# Patient Record
Sex: Female | Born: 1980 | Race: White | Hispanic: No | Marital: Single | Smoking: Current every day smoker
Health system: Southern US, Community
[De-identification: ages and names within clinical notes are randomized; demographics above are authoritative.]

## PROBLEM LIST (undated history)

## (undated) DIAGNOSIS — K8020 Calculus of gallbladder without cholecystitis without obstruction: Secondary | ICD-10-CM

## (undated) DIAGNOSIS — K219 Gastro-esophageal reflux disease without esophagitis: Secondary | ICD-10-CM

## (undated) HISTORY — DX: Calculus of gallbladder without cholecystitis without obstruction: K80.20

## (undated) HISTORY — PX: CHOLECYSTECTOMY: SHX55

## (undated) HISTORY — PX: APPENDECTOMY: SHX54

---
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
## 2001-02-04 ENCOUNTER — Other Ambulatory Visit: Admission: RE | Admit: 2001-02-04 | Discharge: 2001-02-04 | Payer: Self-pay | Admitting: Obstetrics & Gynecology

## 2001-02-04 ENCOUNTER — Other Ambulatory Visit: Admission: RE | Admit: 2001-02-04 | Discharge: 2001-02-04 | Payer: Self-pay | Admitting: Gynecology

## 2002-02-09 ENCOUNTER — Other Ambulatory Visit: Admission: RE | Admit: 2002-02-09 | Discharge: 2002-02-09 | Payer: Self-pay | Admitting: Obstetrics and Gynecology

## 2003-04-01 ENCOUNTER — Encounter: Payer: Self-pay | Admitting: Family Medicine

## 2003-04-01 ENCOUNTER — Encounter: Admission: RE | Admit: 2003-04-01 | Discharge: 2003-04-01 | Payer: Self-pay | Admitting: Family Medicine

## 2004-04-27 ENCOUNTER — Emergency Department (HOSPITAL_COMMUNITY): Admission: EM | Admit: 2004-04-27 | Discharge: 2004-04-27 | Payer: Self-pay | Admitting: Emergency Medicine

## 2010-07-14 ENCOUNTER — Encounter: Payer: Self-pay | Admitting: Neurology

## 2011-06-02 ENCOUNTER — Encounter: Payer: Self-pay | Admitting: Emergency Medicine

## 2011-06-02 ENCOUNTER — Emergency Department (HOSPITAL_BASED_OUTPATIENT_CLINIC_OR_DEPARTMENT_OTHER)
Admission: EM | Admit: 2011-06-02 | Discharge: 2011-06-02 | Disposition: A | Discharge status: Home / Self Care | Payer: BC Managed Care – PPO | Attending: Emergency Medicine | Admitting: Emergency Medicine

## 2011-06-02 DIAGNOSIS — J069 Acute upper respiratory infection, unspecified: Secondary | ICD-10-CM | POA: Insufficient documentation

## 2011-06-02 DIAGNOSIS — F17200 Nicotine dependence, unspecified, uncomplicated: Secondary | ICD-10-CM | POA: Insufficient documentation

## 2011-06-02 DIAGNOSIS — K219 Gastro-esophageal reflux disease without esophagitis: Secondary | ICD-10-CM | POA: Insufficient documentation

## 2011-06-02 HISTORY — DX: Gastro-esophageal reflux disease without esophagitis: K21.9

## 2011-06-02 LAB — RAPID STREP SCREEN (MED CTR MEBANE ONLY): Streptococcus, Group A Screen (Direct): NEGATIVE

## 2011-06-02 NOTE — ED Notes (Signed)
Flu-like symptoms since Friday.  Runny nose, fever, cough, general aches, No GI symptoms.  Bilateral ear pain/pressure.

## 2011-06-02 NOTE — ED Provider Notes (Signed)
History     CSN: [id number] Arrival date & time: 06/02/2011 11:47 AM   First MD Initiated Contact with Patient 06/02/11 1205      Chief Complaint  Patient presents with  . Nasal Congestion  . Cough  . Fever  . Influenza    (Consider location/radiation/quality/duration/timing/severity/associated sxs/prior treatment) HPI Comments: Patient present complaining of cough and cold symptoms since Friday.  She notes that her 30-year-old son who is also here with her as a patient today has had similar symptoms since Wednesday.  She describes subjective on and off fevers, mild headaches and sinus congestion and drainage.  No nausea or vomiting.  Mild cough.  No chest pain or shortness of breath.  Patient is a 30 y.o. female presenting with cough, fever, and flu symptoms. The history is provided by the patient. No language interpreter was used.  Cough This is a new problem. The current episode started 2 days ago. The problem occurs hourly. The problem has not changed since onset.The cough is non-productive. Associated symptoms include headaches, rhinorrhea, sore throat and myalgias. Pertinent negatives include no chest pain, no chills, no shortness of breath and no eye redness.  Fever Primary symptoms of the febrile illness include fever, headaches, cough and myalgias. Primary symptoms do not include shortness of breath, abdominal pain, nausea, vomiting, diarrhea, dysuria or rash.  Influenza Associated symptoms include headaches. Pertinent negatives include no chest pain, no abdominal pain and no shortness of breath.    Past Medical History  Diagnosis Date  . GERD (gastroesophageal reflux disease)     Past Surgical History  Procedure Date  . Cesarean section   . Appendectomy     History reviewed. No pertinent family history.  History  Substance Use Topics  . Smoking status: Current Everyday Smoker -- 0.5 packs/day  . Smokeless tobacco: Not on file  . Alcohol Use: Yes     occasional      OB History    Grav Para Term Preterm Abortions TAB SAB Ect Mult Living                  Review of Systems  Constitutional: Positive for fever. Negative for chills.  HENT: Positive for congestion, sore throat, rhinorrhea and sinus pressure.   Eyes: Negative.  Negative for discharge and redness.  Respiratory: Positive for cough. Negative for shortness of breath.   Cardiovascular: Negative.  Negative for chest pain.  Gastrointestinal: Negative.  Negative for nausea, vomiting, abdominal pain and diarrhea.  Genitourinary: Negative.  Negative for dysuria and vaginal discharge.  Musculoskeletal: Positive for myalgias. Negative for back pain.  Skin: Negative.  Negative for color change and rash.  Neurological: Positive for headaches. Negative for syncope.  Hematological: Negative.  Negative for adenopathy.  Psychiatric/Behavioral: Negative.  Negative for confusion.  All other systems reviewed and are negative.    Allergies  Review of patient's allergies indicates no known allergies.  Home Medications   Current Outpatient Rx  Name Route Sig Dispense Refill  . PANTOPRAZOLE SODIUM 40 MG PO TBEC Oral Take 40 mg by mouth daily.      Marland Kitchen PHENTERMINE HCL 15 MG PO CAPS Oral Take 15 mg by mouth every morning.        BP 103/56  Pulse 86  Temp(Src) 98.5 F (36.9 C) (Oral)  Resp 20  SpO2 99%  LMP 05/19/2011  Physical Exam  Constitutional: She is oriented to person, place, and time. She appears well-developed and well-nourished.  Non-toxic appearance. She does not have  a sickly appearance.  HENT:  Head: Normocephalic and atraumatic.  Mouth/Throat: No oropharyngeal exudate.       Bilaterally erythematous and slightly swollen tonsils.  No exudates.  Uvula midline.  Eyes: Conjunctivae, EOM and lids are normal. Pupils are equal, round, and reactive to light. No scleral icterus.  Neck: Trachea normal and normal range of motion. Neck supple.  Cardiovascular: Normal rate, regular rhythm and  normal heart sounds.   Pulmonary/Chest: Effort normal and breath sounds normal. No respiratory distress. She has no wheezes. She has no rales.  Abdominal: Soft. Normal appearance. There is no tenderness. There is no rebound, no guarding and no CVA tenderness.  Musculoskeletal: Normal range of motion.  Neurological: She is alert and oriented to person, place, and time. She has normal strength.  Skin: Skin is warm, dry and intact. No rash noted.  Psychiatric: She has a normal mood and affect. Her behavior is normal. Judgment and thought content normal.    ED Course  Procedures (including critical care time)  Results for orders placed during the hospital encounter of 06/02/11  RAPID STREP SCREEN      Component Value Range   Streptococcus, Group A Screen (Direct) NEGATIVE  NEGATIVE         MDM  Patient with likely viral syndrome possible influenza.  At this point I feel she is safe for discharge home as her vital signs are normal.  She has been counseled regarding ibuprofen and Tylenol as well as over-the-counter cold medicines.        Nat Christen, MD 06/02/11 1247

## 2011-06-02 NOTE — ED Notes (Signed)
Pt and family left prior to receiving written instructions.  Verbal instructions given per Dr. Golda Acre.  Written instructions mailed to home per [place] postal [employer].

## 2011-06-02 NOTE — ED Notes (Signed)
Pt left without receiving written instructions.  Pt received verbal instructions per Dr. Golda Acre.  Instructions mailed to patients home.

## 2012-07-19 ENCOUNTER — Encounter (HOSPITAL_BASED_OUTPATIENT_CLINIC_OR_DEPARTMENT_OTHER): Payer: Self-pay

## 2012-07-19 ENCOUNTER — Emergency Department (HOSPITAL_BASED_OUTPATIENT_CLINIC_OR_DEPARTMENT_OTHER): Payer: BC Managed Care – PPO

## 2012-07-19 ENCOUNTER — Emergency Department (HOSPITAL_BASED_OUTPATIENT_CLINIC_OR_DEPARTMENT_OTHER)
Admission: EM | Admit: 2012-07-19 | Discharge: 2012-07-19 | Disposition: A | Discharge status: Home / Self Care | Payer: BC Managed Care – PPO | Attending: Emergency Medicine | Admitting: Emergency Medicine

## 2012-07-19 DIAGNOSIS — Z79899 Other long term (current) drug therapy: Secondary | ICD-10-CM | POA: Insufficient documentation

## 2012-07-19 DIAGNOSIS — K8070 Calculus of gallbladder and bile duct without cholecystitis without obstruction: Secondary | ICD-10-CM | POA: Insufficient documentation

## 2012-07-19 DIAGNOSIS — R079 Chest pain, unspecified: Secondary | ICD-10-CM | POA: Insufficient documentation

## 2012-07-19 DIAGNOSIS — Z3202 Encounter for pregnancy test, result negative: Secondary | ICD-10-CM | POA: Insufficient documentation

## 2012-07-19 DIAGNOSIS — K8020 Calculus of gallbladder without cholecystitis without obstruction: Secondary | ICD-10-CM

## 2012-07-19 DIAGNOSIS — R630 Anorexia: Secondary | ICD-10-CM | POA: Insufficient documentation

## 2012-07-19 DIAGNOSIS — F17200 Nicotine dependence, unspecified, uncomplicated: Secondary | ICD-10-CM | POA: Insufficient documentation

## 2012-07-19 DIAGNOSIS — R110 Nausea: Secondary | ICD-10-CM | POA: Insufficient documentation

## 2012-07-19 DIAGNOSIS — K219 Gastro-esophageal reflux disease without esophagitis: Secondary | ICD-10-CM | POA: Insufficient documentation

## 2012-07-19 DIAGNOSIS — M549 Dorsalgia, unspecified: Secondary | ICD-10-CM | POA: Insufficient documentation

## 2012-07-19 LAB — COMPREHENSIVE METABOLIC PANEL
Albumin: 3.7 g/dL (ref 3.5–5.2)
Alkaline Phosphatase: 45 U/L (ref 39–117)
BUN: 11 mg/dL (ref 6–23)
CO2: 23 mEq/L (ref 19–32)
Chloride: 102 mEq/L (ref 96–112)
Creatinine, Ser: 0.7 mg/dL (ref 0.50–1.10)
GFR calc non Af Amer: >90 mL/min (ref >90)
Glucose, Bld: 113 mg/dL — ABNORMAL HIGH (ref 70–99)
Potassium: 4.4 mEq/L (ref 3.5–5.1)
Total Bilirubin: 0.2 mg/dL — ABNORMAL LOW (ref 0.3–1.2)

## 2012-07-19 LAB — CBC WITH DIFFERENTIAL/PLATELET
HCT: 34.9 % — ABNORMAL LOW (ref 36.0–46.0)
Hemoglobin: 12 g/dL (ref 12.0–15.0)
Lymphocytes Relative: 21 % (ref 12–46)
Lymphs Abs: 2.5 10*3/uL (ref 0.7–4.0)
MCHC: 34.4 g/dL (ref 30.0–36.0)
Monocytes Absolute: 0.3 10*3/uL (ref 0.1–1.0)
Monocytes Relative: 3 % (ref 3–12)
Neutro Abs: 9 10*3/uL — ABNORMAL HIGH (ref 1.7–7.7)
Neutrophils Relative %: 76 % (ref 43–77)
RBC: 3.74 MIL/uL — ABNORMAL LOW (ref 3.87–5.11)

## 2012-07-19 LAB — URINALYSIS, ROUTINE W REFLEX MICROSCOPIC
Bilirubin Urine: NEGATIVE
Ketones, ur: NEGATIVE mg/dL
Nitrite: NEGATIVE
Protein, ur: NEGATIVE mg/dL
pH: 6 (ref 5.0–8.0)

## 2012-07-19 LAB — LIPASE, BLOOD: Lipase: 21 U/L (ref 11–59)

## 2012-07-19 MED ORDER — SODIUM CHLORIDE 0.9 % IV BOLUS (SEPSIS)
1000.0000 mL | Freq: Once | INTRAVENOUS | Status: AC
  Administered 2012-07-19: 1000 mL via INTRAVENOUS

## 2012-07-19 MED ORDER — HYDROMORPHONE HCL PF 1 MG/ML IJ SOLN
1.0000 mg | Freq: Once | INTRAMUSCULAR | Status: AC
  Administered 2012-07-19: 1 mg via INTRAVENOUS
  Filled 2012-07-19: qty 1

## 2012-07-19 MED ORDER — OXYCODONE-ACETAMINOPHEN 5-325 MG PO TABS: 1.0000 | ORAL_TABLET | ORAL | Status: AC | PRN

## 2012-07-19 MED ORDER — ONDANSETRON 8 MG PO TBDP: 8.0000 mg | ORAL_TABLET | Freq: Three times a day (TID) | ORAL | Status: AC | PRN

## 2012-07-19 MED ORDER — ONDANSETRON HCL 4 MG/2ML IJ SOLN
4.0000 mg | Freq: Once | INTRAMUSCULAR | Status: AC
  Administered 2012-07-19: 4 mg via INTRAVENOUS
  Filled 2012-07-19: qty 2

## 2012-07-19 NOTE — ED Notes (Signed)
Pt states that she has bilateral lower chest wall pain and upper abdominal pain.  Pt states that she has severe sharp stabbing pain which starts in the chest and radiates through to the back.  Pt states that she woke up at 0300 with this pain and it has not subsided since that time.  Pt denies sob, n/v, diaphoresis, hemoptysis, cough, fever.

## 2012-07-19 NOTE — ED Notes (Signed)
MD at bedside. 

## 2012-07-19 NOTE — ED Provider Notes (Signed)
History     CSN: [id number]  Arrival date & time 07/19/12  0865   First MD Initiated Contact with Patient 07/19/12 1021      Chief Complaint  Patient presents with  . Abdominal Pain  . Chest Pain    (Consider location/radiation/quality/duration/timing/severity/associated sxs/prior treatment) HPI Comments: Pain with inspiration.   Patient is a 32 y.o. female presenting with abdominal pain and chest pain. The history is provided by the patient.  Abdominal Pain The primary symptoms of the illness include abdominal pain and nausea. The primary symptoms of the illness do not include fever, fatigue, shortness of breath, vomiting, diarrhea, hematemesis, hematochezia, dysuria, vaginal discharge or vaginal bleeding. The current episode started 6 to 12 hours ago. The onset of the illness was sudden. The problem has not changed since onset. The patient states that she believes she is currently not pregnant. The patient has not had a change in bowel habit. Additional symptoms associated with the illness include anorexia and back pain. Symptoms associated with the illness do not include chills, urgency, hematuria or frequency. Significant associated medical issues include GERD. Significant associated medical issues do not include PUD, inflammatory bowel disease, diabetes, sickle cell disease, gallstones, liver disease, substance abuse, diverticulitis, HIV or cardiac disease.  Chest Pain Primary symptoms include abdominal pain and nausea. Pertinent negatives for primary symptoms include no fever, no fatigue, no shortness of breath and no vomiting.  Pertinent negatives for past medical history include no diabetes and no sickle cell disease.     Patient with epigastric pain since 0300.  Pain radiates to back and is sharp and stabbing.  Patient has some nausea but no vomiting.  She has not eaten today.  She denies fever, chills, cough, uti symptoms, similar symptoms in past.  She has one child and lmp was  two weeks ago, she is on bcp and denies abnormal vaginal discharge or h.o. Stds.  Pain is 7-8.  She took ibuprofen without relief.   Past Medical History  Diagnosis Date  . GERD (gastroesophageal reflux disease)     Past Surgical History  Procedure Date  . Cesarean section   . Appendectomy     History reviewed. No pertinent family history.  History  Substance Use Topics  . Smoking status: Current Every Day Smoker -- 0.5 packs/day    Types: Cigarettes  . Smokeless tobacco: Never Used  . Alcohol Use: Yes     Comment: occasional    OB History    Grav Para Term Preterm Abortions TAB SAB Ect Mult Living                  Review of Systems  Constitutional: Negative for fever, chills and fatigue.  Respiratory: Negative for shortness of breath.   Cardiovascular: Positive for chest pain.  Gastrointestinal: Positive for nausea, abdominal pain and anorexia. Negative for vomiting, diarrhea, hematochezia and hematemesis.  Genitourinary: Negative for dysuria, urgency, frequency, hematuria, vaginal bleeding and vaginal discharge.  Musculoskeletal: Positive for back pain.  All other systems reviewed and are negative.    Allergies  Review of patient's allergies indicates no known allergies.  Home Medications   Current Outpatient Rx  Name  Route  Sig  Dispense  Refill  . IBUPROFEN 200 MG PO TABS   Oral   Take 600 mg by mouth every 6 (six) hours as needed.         Kathrynn Running ESTRAD-FE BIPHAS 1 MG-10 MCG / 10 MCG PO TABS   Oral  Take 1 tablet by mouth daily.         Marland Kitchen PANTOPRAZOLE SODIUM 40 MG PO TBEC   Oral   Take 40 mg by mouth daily.           Marland Kitchen PHENTERMINE HCL 15 MG PO CAPS   Oral   Take 15 mg by mouth every morning.             BP 111/59  Pulse 77  Temp 98.7 F (37.1 C) (Oral)  Resp 16  Ht 5' (1.524 m)  Wt 135 lb (61.236 kg)  BMI 26.37 kg/m2  SpO2 100%  LMP 07/05/2012  Physical Exam  Nursing note and vitals reviewed. Constitutional: She  appears well-developed and well-nourished.  HENT:  Head: Normocephalic and atraumatic.  Eyes: Conjunctivae normal and EOM are normal. Pupils are equal, round, and reactive to light.  Neck: Normal range of motion. Neck supple.  Cardiovascular: Normal rate, regular rhythm, normal heart sounds and intact distal pulses.   Pulmonary/Chest: Effort normal and breath sounds normal.  Abdominal: Soft. Bowel sounds are normal. There is tenderness.       ruq pain with palpation.   Musculoskeletal: Normal range of motion.  Neurological: She is alert.  Skin: Skin is warm and dry.  Psychiatric: She has a normal mood and affect. Thought content normal.    ED Course  Procedures (including critical care time)  Labs Reviewed - No data to display No results found.   No diagnosis found.  Results for orders placed during the hospital encounter of 07/19/12  CBC WITH DIFFERENTIAL      Component Value Range   WBC 11.9 (*) 4.0 - 10.5 K/uL   RBC 3.74 (*) 3.87 - 5.11 MIL/uL   Hemoglobin 12.0  12.0 - 15.0 g/dL   HCT 16.1 (*) 09.6 - 04.5 %   MCV 93.3  78.0 - 100.0 fL   MCH 32.1  26.0 - 34.0 pg   MCHC 34.4  30.0 - 36.0 g/dL   RDW 40.9  81.1 - 91.4 %   Platelets 277  150 - 400 K/uL   Neutrophils Relative 76  43 - 77 %   Neutro Abs 9.0 (*) 1.7 - 7.7 K/uL   Lymphocytes Relative 21  12 - 46 %   Lymphs Abs 2.5  0.7 - 4.0 K/uL   Monocytes Relative 3  3 - 12 %   Monocytes Absolute 0.3  0.1 - 1.0 K/uL   Eosinophils Relative 0  0 - 5 %   Eosinophils Absolute 0.1  0.0 - 0.7 K/uL   Basophils Relative 0  0 - 1 %   Basophils Absolute 0.0  0.0 - 0.1 K/uL  COMPREHENSIVE METABOLIC PANEL      Component Value Range   Sodium 137  135 - 145 mEq/L   Potassium 4.4  3.5 - 5.1 mEq/L   Chloride 102  96 - 112 mEq/L   CO2 23  19 - 32 mEq/L   Glucose, Bld 113 (*) 70 - 99 mg/dL   BUN 11  6 - 23 mg/dL   Creatinine, Ser 7.82  0.50 - 1.10 mg/dL   Calcium 9.4  8.4 - 95.6 mg/dL   Total Protein 6.8  6.0 - 8.3 g/dL   Albumin  3.7  3.5 - 5.2 g/dL   AST 17  0 - 37 U/L   ALT 15  0 - 35 U/L   Alkaline Phosphatase 45  39 - 117 U/L   Total Bilirubin 0.2 (*)  0.3 - 1.2 mg/dL   GFR calc non Af Amer >90  >90 mL/min   GFR calc Af Amer >90  >90 mL/min  LIPASE, BLOOD      Component Value Range   Lipase 21  11 - 59 U/L  URINALYSIS, ROUTINE W REFLEX MICROSCOPIC      Component Value Range   Color, Urine YELLOW  YELLOW   APPearance CLEAR  CLEAR   Specific Gravity, Urine 1.005  1.005 - 1.030   pH 6.0  5.0 - 8.0   Glucose, UA NEGATIVE  NEGATIVE mg/dL   Hgb urine dipstick NEGATIVE  NEGATIVE   Bilirubin Urine NEGATIVE  NEGATIVE   Ketones, ur NEGATIVE  NEGATIVE mg/dL   Protein, ur NEGATIVE  NEGATIVE mg/dL   Urobilinogen, UA 0.2  0.0 - 1.0 mg/dL   Nitrite NEGATIVE  NEGATIVE   Leukocytes, UA NEGATIVE  NEGATIVE  PREGNANCY, URINE      Component Value Range   Preg Test, Ur NEGATIVE  NEGATIVE     Patient feels improved after pain meds which were given just prior to ultrasound.  Patient with elevated wbc.  Discussed with Dr. Ezzard Standing and patient.  Patient wishes discharge and is given precautions for return.  She is referred to the general surgery office to be seen asap and arrange for op surgery.          Hilario Quarry, MD 07/19/12 1339

## 2012-07-20 ENCOUNTER — Ambulatory Visit (INDEPENDENT_AMBULATORY_CARE_PROVIDER_SITE_OTHER): Payer: BC Managed Care – PPO | Admitting: General Surgery

## 2012-07-20 ENCOUNTER — Encounter (INDEPENDENT_AMBULATORY_CARE_PROVIDER_SITE_OTHER): Payer: Self-pay | Admitting: General Surgery

## 2012-07-20 ENCOUNTER — Telehealth (INDEPENDENT_AMBULATORY_CARE_PROVIDER_SITE_OTHER): Payer: Self-pay

## 2012-07-20 VITALS — BP 108/70 | HR 72 | Temp 98.0°F | Resp 18 | Ht 60.0 in | Wt 134.4 lb

## 2012-07-20 DIAGNOSIS — K8020 Calculus of gallbladder without cholecystitis without obstruction: Secondary | ICD-10-CM | POA: Insufficient documentation

## 2012-07-20 NOTE — Patient Instructions (Signed)
Strict low-fat diet as discussed.  [facility] ______[facility] LAPAROSCOPIC SURGERY: POST OP INSTRUCTIONS Always review your discharge instruction sheet given to you by the facility where your surgery was performed. IF YOU HAVE DISABILITY OR FAMILY LEAVE FORMS, YOU MUST BRING THEM TO THE OFFICE FOR PROCESSING.   DO NOT GIVE THEM TO YOUR DOCTOR.  1. A prescription for pain medication may be given to you upon discharge.  Take your pain medication as prescribed, if needed.  If narcotic pain medicine is not needed, then you may take acetaminophen (Tylenol) or ibuprofen (Advil) as needed. 2. Take your usually prescribed medications unless otherwise directed. 3. If you need a refill on your pain medication, please contact your pharmacy.  They will contact our office to request authorization. Prescriptions will not be filled after 5pm or on week-ends. 4. You should follow a light diet the first few days after arrival home, such as soup and crackers, etc.  Be sure to include lots of fluids daily. 5. Most patients will experience some swelling and bruising in the area of the incisions.  Ice packs will help.  Swelling and bruising can take several days to resolve.  6. It is common to experience some constipation if taking pain medication after surgery.  Increasing fluid intake and taking a stool softener (such as Colace) will usually help or prevent this problem from occurring.  A mild laxative (Milk of Magnesia or Miralax) should be taken according to package instructions if there are no bowel movements after 48 hours. 7. Unless discharge instructions indicate otherwise, you may remove your bandages 24-48 hours after surgery, and you may shower at that time.  You may have steri-strips (small skin tapes) in place directly over the incision.  These strips should be left on the skin for 7-10 days.  If your surgeon used skin glue on the incision, you may shower in 24 hours.  The glue will flake off  over the next 2-3 weeks.  Any sutures or staples will be removed at the office during your follow-up visit. 8. ACTIVITIES:  You may resume regular (light) daily activities beginning the next day-such as daily self-care, walking, climbing stairs-gradually increasing activities as tolerated.  You may have sexual intercourse when it is comfortable.  Refrain from any heavy lifting or straining until approved by your doctor. a. You may drive when you are no longer taking prescription pain medication, you can comfortably wear a seatbelt, and you can safely maneuver your car and apply brakes. b. RETURN TO WORK:  __________________________________________________________ 9. You should see your doctor in the office for a follow-up appointment approximately 2-3 weeks after your surgery.  Make sure that you call for this appointment within a day or two after you arrive home to insure a convenient appointment time. 10. OTHER INSTRUCTIONS: __________________________________________________________________________________________________________________________ __________________________________________________________________________________________________________________________ WHEN TO CALL YOUR DOCTOR: 1. Fever over 101.0 2. Inability to urinate 3. Continued bleeding from incision. 4. Increased pain, redness, or drainage from the incision. 5. Increasing abdominal pain  The clinic staff is available to answer your questions during regular business hours.  Please don't hesitate to call and ask to speak to one of the nurses for clinical concerns.  If you have a medical emergency, go to the nearest emergency room or call 911.  A surgeon from [facility] is always on call at the hospital. [address], [address], [place], [place]  [address] ? [address] [address], [place], [place]   [address] [phone] ? [phone] ?  FAX [phone] Web site: [email or web address]

## 2012-07-20 NOTE — Progress Notes (Signed)
Patient ID: Donna Duncan, female   DOB: Jun 30, 1980, 32 y.o.   MRN: [id number]  No chief complaint on file.   HPI Donna Duncan is a 32 y.o. female.   HPI  She is referred by Dr. Rosalia Hammers from the emergency department to evaluate symptomatic cholelithiasis. 2 nights ago she had some pizza then awoke early the next morning with severe pressure-type epigastric pain radiating to the back and associated with nausea and vomiting. She went to the emergency department was evaluated. Lipase liver function tests were normal. Ultrasound demonstrated cholelithiasis with no evidence of acute cholecystitis. She improved while in the emergency department and was discharged with pain medication and nausea medication. She was subsequently told to call here for an appointment. She has not had anything like this before. No family history of gallbladder disease.  Past Medical History  Diagnosis Date  . GERD (gastroesophageal reflux disease)   . Gallstones     Past Surgical History  Procedure Date  . Cesarean section   . Appendectomy     No family history on file.  Social History History  Substance Use Topics  . Smoking status: Current Every Day Smoker -- 0.5 packs/day    Types: Cigarettes  . Smokeless tobacco: Never Used  . Alcohol Use: Yes     Comment: occasional    No Known Allergies  Current Outpatient Prescriptions  Medication Sig Dispense Refill  . ibuprofen (ADVIL,MOTRIN) 200 MG tablet Take 600 mg by mouth every 6 (six) hours as needed.      . Norethindrone-Ethinyl Estradiol-Fe Biphas (LO LOESTRIN FE) 1 MG-10 MCG / 10 MCG tablet Take 1 tablet by mouth daily.      . ondansetron (ZOFRAN ODT) 8 MG disintegrating tablet Take 1 tablet (8 mg total) by mouth every 8 (eight) hours as needed for nausea.  20 tablet  0  . oxyCODONE-acetaminophen (PERCOCET/ROXICET) 5-325 MG per tablet Take 1 tablet by mouth every 4 (four) hours as needed for pain.  10 tablet  0  . pantoprazole (PROTONIX) 40 MG  tablet Take 40 mg by mouth daily.        . phentermine 15 MG capsule Take 15 mg by mouth every morning.          Review of Systems Review of Systems  Constitutional: Negative for fever and chills.  Respiratory: Negative.   Gastrointestinal: Positive for nausea and abdominal pain.  Genitourinary: Negative.   Hematological: Negative.     Blood pressure 108/70, pulse 72, temperature 98 F (36.7 C), temperature source Temporal, resp. rate 18, height 5' (1.524 m), weight 134 lb 6.4 oz (60.963 kg), last menstrual period 07/05/2012.  Physical Exam Physical Exam  Constitutional: She appears well-developed and well-nourished. No distress.  HENT:  Head: Normocephalic and atraumatic.  Eyes: EOM are normal. No scleral icterus.  Neck: Neck supple.  Cardiovascular: Normal rate and regular rhythm.   Pulmonary/Chest: Effort normal and breath sounds normal.  Abdominal: Soft. She exhibits no distension and no mass. There is no tenderness.       Small subumbilical scar  Musculoskeletal: She exhibits no edema.  Lymphadenopathy:    She has no cervical adenopathy.  Neurological: She is alert.  Skin: Skin is warm and dry.    Data Reviewed Emergency department notes. Laboratory values. Ultrasound report.  Assessment    Symptomatic cholelithiasis.    Plan    Laparoscopic possible open cholecystectomy. Strict low-fat diet.  I have explained the procedure, risks, and aftercare of cholecystectomy.  Risks  include but are not limited to bleeding, infection, wound problems, anesthesia, diarrhea, bile leak, injury to common bile duct/liver/intestine.  She seems to understand and agrees to proceed.        Mahiya Kercheval J 07/20/2012, 3:18 PM

## 2012-07-20 NOTE — Telephone Encounter (Signed)
Rx Hydrocodone 5/325 #30 1-2 q 4-6 hrs prn pain called to pharmacy.  Pt is aware.

## 2012-07-24 ENCOUNTER — Telehealth (INDEPENDENT_AMBULATORY_CARE_PROVIDER_SITE_OTHER): Payer: Self-pay | Admitting: General Surgery

## 2012-07-24 NOTE — Telephone Encounter (Signed)
Pt is scheduled for lap chole next week; constipated since Sunday.  Pt states she is taking Colace BID and Miralax 1 capful BID.  She is passing some small amount of gas.  Recommended pt increase po fluids and walk AMAP.  Increase Miralax to 2 capsful in 10-12 oz fluid BID.  She will try all this.

## 2012-07-30 ENCOUNTER — Other Ambulatory Visit (INDEPENDENT_AMBULATORY_CARE_PROVIDER_SITE_OTHER): Payer: Self-pay | Admitting: General Surgery

## 2012-07-30 DIAGNOSIS — K8010 Calculus of gallbladder with chronic cholecystitis without obstruction: Secondary | ICD-10-CM

## 2012-08-03 ENCOUNTER — Telehealth (INDEPENDENT_AMBULATORY_CARE_PROVIDER_SITE_OTHER): Payer: Self-pay

## 2012-08-03 NOTE — Telephone Encounter (Signed)
The pt called requesting a refill on pain medicine.  She was on Percocet.  Her incisions are a little swollen and tender when she walks around.  She has no fever and drainage.  This is her 1st refill.  I called in the protocol to [facility] in [place] [phone].  I called in Hydrocodone 5/325 one tab po q 4-6 prn pain no refills #30.

## 2012-08-13 ENCOUNTER — Telehealth (INDEPENDENT_AMBULATORY_CARE_PROVIDER_SITE_OTHER): Payer: Self-pay | Admitting: Surgery

## 2012-08-13 ENCOUNTER — Telehealth (INDEPENDENT_AMBULATORY_CARE_PROVIDER_SITE_OTHER): Payer: Self-pay | Admitting: General Surgery

## 2012-08-13 NOTE — Telephone Encounter (Signed)
LMOV for pt to call regarding pain medication refill.  Dr. Abbey Chatters ok'd protocol Vicodin #30 w/ no refills.

## 2012-08-13 NOTE — Telephone Encounter (Signed)
Pt called to request standard pain medication refill. This will be her second request. She only uses it at night time and she is scheduled to see Dr. Abbey Chatters on 08-18-12. Gallbladder surgery, intermittent pain around navel/ [facility] [place]- [phone]  / pt's call back # [phone]/ gy

## 2012-08-13 NOTE — Telephone Encounter (Signed)
Called in refill for Vicodin #20.  Called patient back to inform her Rx was at pharmacy.  Velora Heckler, MD, [facility] Office: [phone]

## 2012-08-18 ENCOUNTER — Encounter (INDEPENDENT_AMBULATORY_CARE_PROVIDER_SITE_OTHER): Payer: BC Managed Care – PPO | Admitting: General Surgery

## 2012-08-20 ENCOUNTER — Encounter (INDEPENDENT_AMBULATORY_CARE_PROVIDER_SITE_OTHER): Payer: Self-pay | Admitting: General Surgery

## 2014-12-18 IMAGING — US US ABDOMEN COMPLETE
1 series · 14 of 25 positions shown · non-contrast
Comparison: None.

CLINICAL DATA: Abdominal pain

COMPLETE ABDOMINAL ULTRASOUND

[Series 1: us abdomen complete · 0.32mm/px · 14 of 88 slices shown]
[im 1/88]
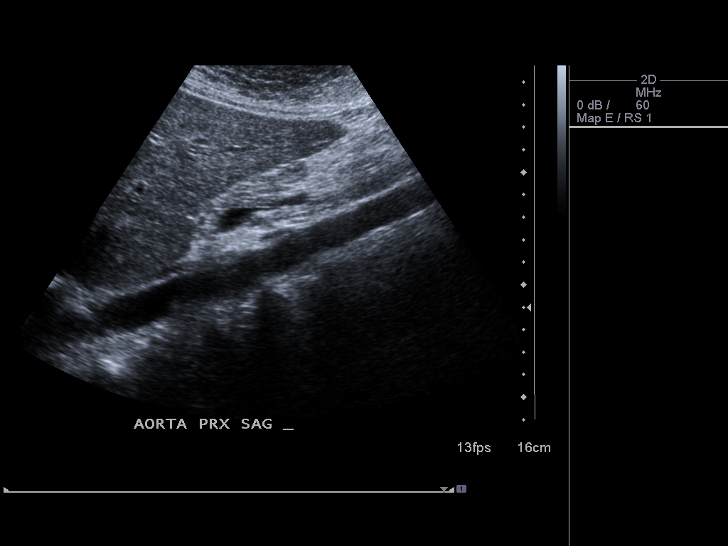
[im 8/88]
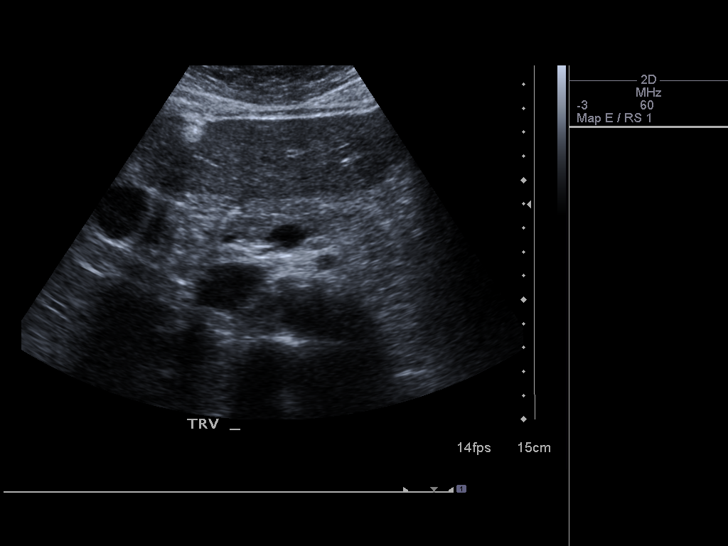
[im 15/88]
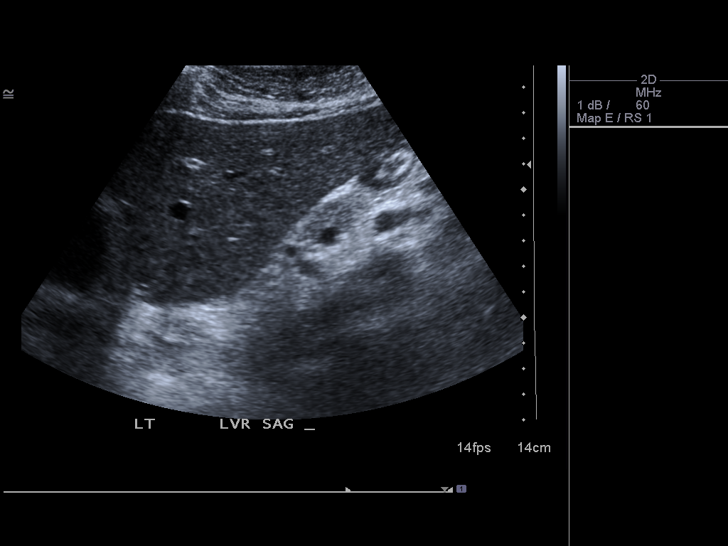
[im 22/88]
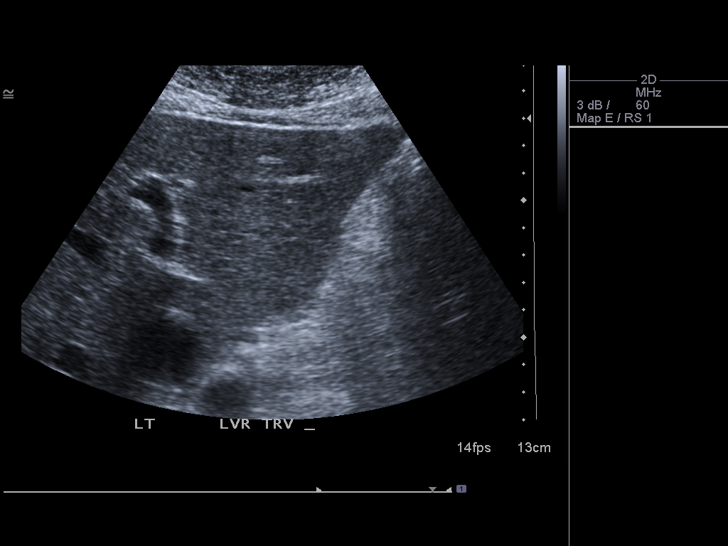
[im 30/88]
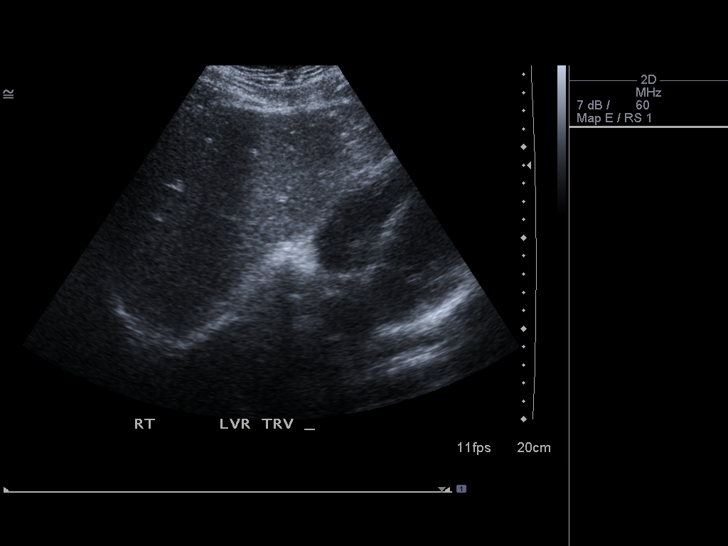
[im 33/88]
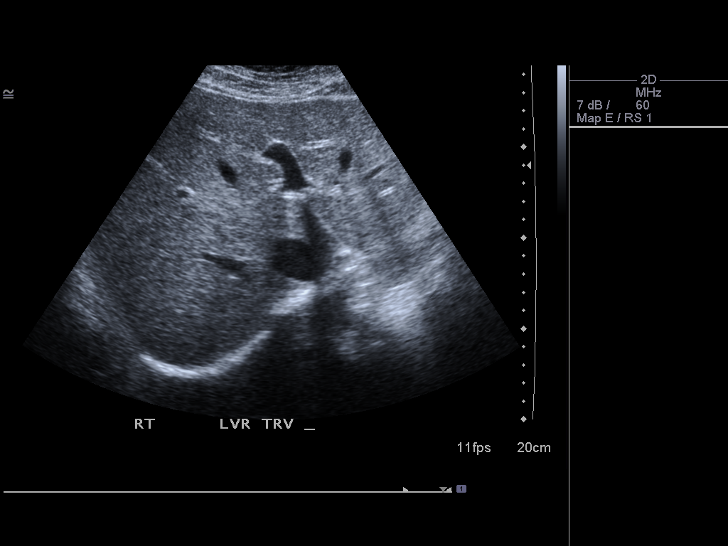
[im 40/88]
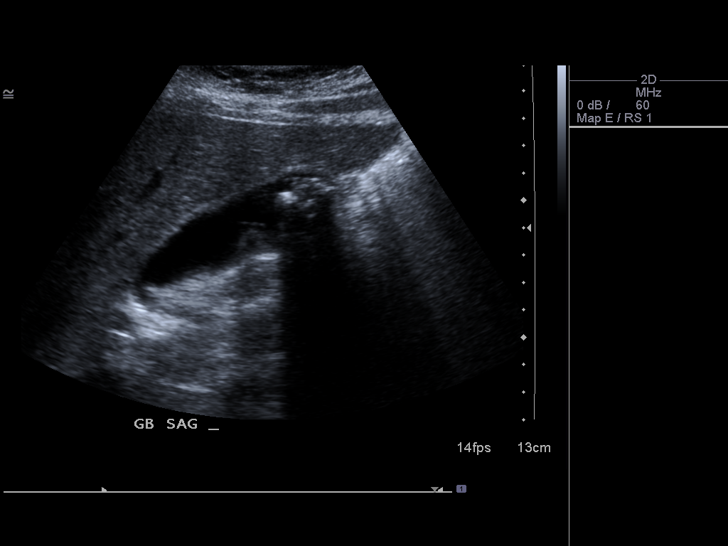
[im 48/88]
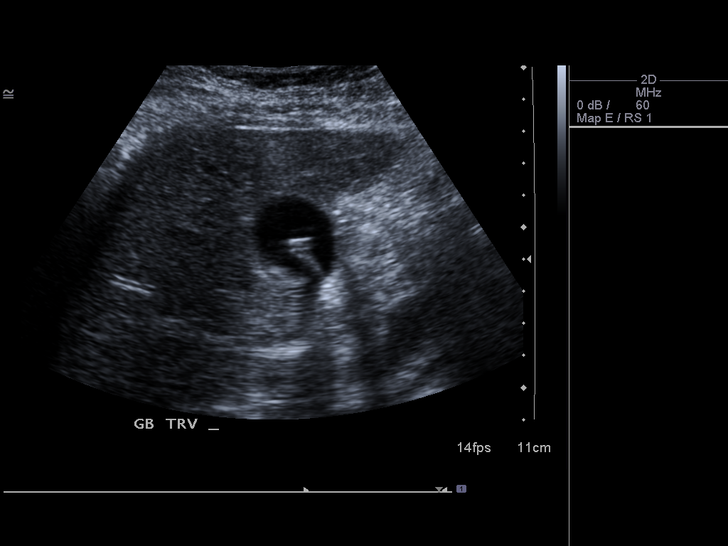
[im 55/88]
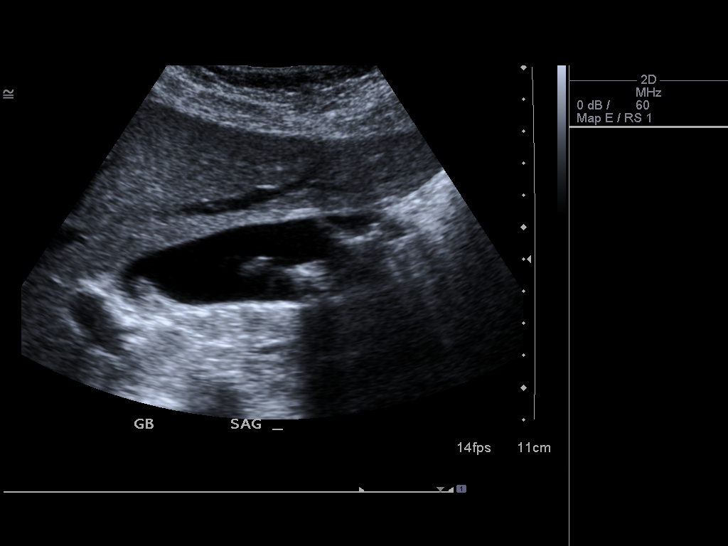
[im 59/88]
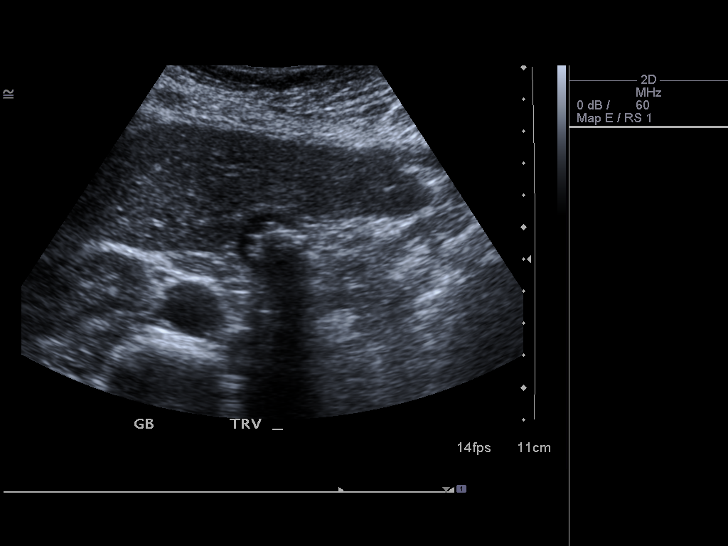
[im 66/88]
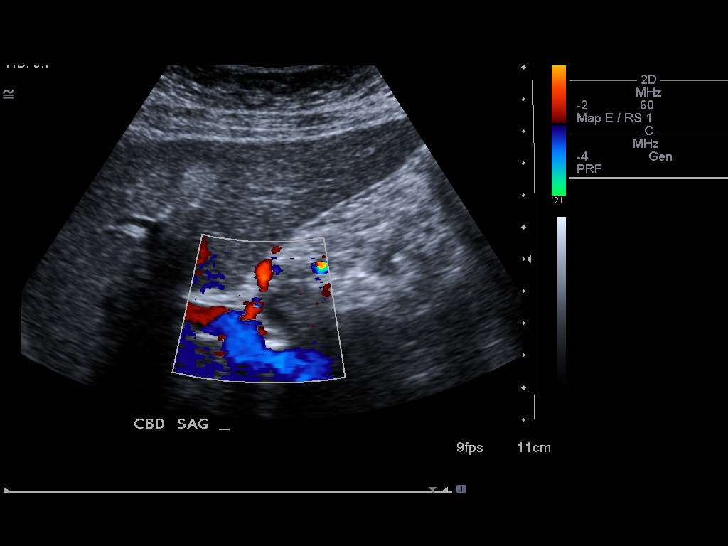
[im 73/88]
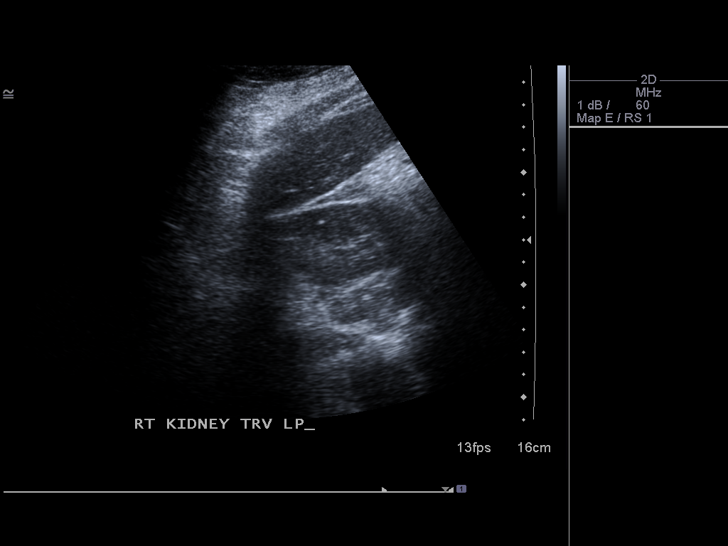
[im 80/88]
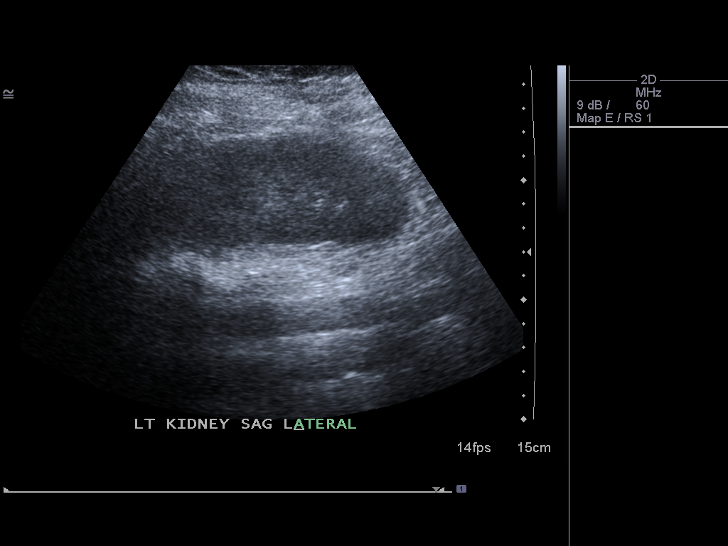
[im 88/88]
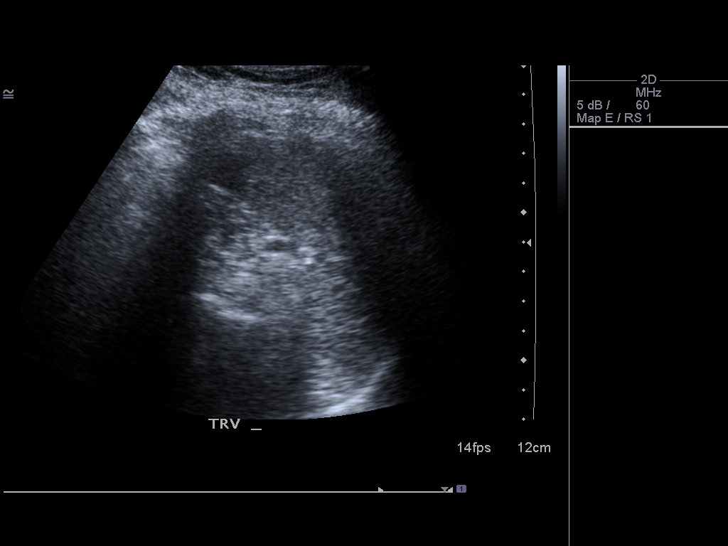

[14 of 25 positions shown; findings below may reference images not displayed]

FINDINGS: Gallbladder:  Gallstones are noted in the gallbladder the largest
measures 8 mm.  No thickening of the gallbladder wall.  No
sonographic Murphy's sign.

Common bile duct:  Measures 1.6 mm in diameter within normal
limits.

Liver:  No focal lesion identified.  Within normal limits in
parenchymal echogenicity.

IVC:  Limited assessment due to abundant bowel gas.

Pancreas:  Limited assessment due to abundant bowel gas.

Spleen:  Measures 5.5 cm in length.  Normal echogenicity.

Right Kidney:  Measures 10.4 cm in length.  No mass, hydronephrosis
or diagnostic renal calculus

Left Kidney:  Measures 10.8 cm in length.  No mass, hydronephrosis
or diagnostic renal calculus

Abdominal aorta:  No aneurysm identified. Measures up to 2.7 cm in
diameter.
IMPRESSION: 1.  Gallstones are noted within gallbladder the largest measures 8
mm.
2.  No sonographic Murphy's sign.  Normal CBD.
3.  No hydronephrosis or diagnostic renal calculus.

## 2018-02-27 ENCOUNTER — Other Ambulatory Visit: Payer: Self-pay

## 2018-02-27 ENCOUNTER — Encounter (HOSPITAL_BASED_OUTPATIENT_CLINIC_OR_DEPARTMENT_OTHER): Payer: Self-pay | Admitting: Emergency Medicine

## 2018-02-27 ENCOUNTER — Emergency Department (HOSPITAL_BASED_OUTPATIENT_CLINIC_OR_DEPARTMENT_OTHER)
Admission: EM | Admit: 2018-02-27 | Discharge: 2018-02-27 | Disposition: A | Discharge status: Home / Self Care | Payer: Managed Care, Other (non HMO) | Attending: Emergency Medicine | Admitting: Emergency Medicine

## 2018-02-27 DIAGNOSIS — F17210 Nicotine dependence, cigarettes, uncomplicated: Secondary | ICD-10-CM | POA: Insufficient documentation

## 2018-02-27 DIAGNOSIS — Z79899 Other long term (current) drug therapy: Secondary | ICD-10-CM | POA: Diagnosis not present

## 2018-02-27 DIAGNOSIS — R1030 Lower abdominal pain, unspecified: Secondary | ICD-10-CM | POA: Diagnosis present

## 2018-02-27 DIAGNOSIS — K529 Noninfective gastroenteritis and colitis, unspecified: Secondary | ICD-10-CM

## 2018-02-27 LAB — URINALYSIS, ROUTINE W REFLEX MICROSCOPIC
BILIRUBIN URINE: NEGATIVE
Glucose, UA: NEGATIVE mg/dL
Ketones, ur: 15 mg/dL — AB
Leukocytes, UA: NEGATIVE
NITRITE: NEGATIVE
PROTEIN: 30 mg/dL — AB
Specific Gravity, Urine: 1.01 (ref 1.005–1.030)
pH: 6 (ref 5.0–8.0)

## 2018-02-27 LAB — CBC WITH DIFFERENTIAL/PLATELET
BASOS ABS: 0 10*3/uL (ref 0.0–0.1)
Basophils Relative: 0 %
EOS PCT: 0 %
Eosinophils Absolute: 0 10*3/uL (ref 0.0–0.7)
HCT: 38.9 % (ref 36.0–46.0)
Hemoglobin: 13.7 g/dL (ref 12.0–15.0)
LYMPHS ABS: 1.4 10*3/uL (ref 0.7–4.0)
LYMPHS PCT: 15 %
MCH: 31.8 pg (ref 26.0–34.0)
MCHC: 35.2 g/dL (ref 30.0–36.0)
MCV: 90.3 fL (ref 78.0–100.0)
MONO ABS: 0.6 10*3/uL (ref 0.1–1.0)
Monocytes Relative: 6 %
NEUTROS ABS: 7.1 10*3/uL (ref 1.7–7.7)
Neutrophils Relative %: 79 %
Platelets: 298 10*3/uL (ref 150–400)
RBC: 4.31 MIL/uL (ref 3.87–5.11)
RDW: 12.5 % (ref 11.5–15.5)
WBC: 9.1 10*3/uL (ref 4.0–10.5)

## 2018-02-27 LAB — COMPREHENSIVE METABOLIC PANEL
ALT: 25 U/L (ref 0–44)
AST: 20 U/L (ref 15–41)
Albumin: 3.7 g/dL (ref 3.5–5.0)
Alkaline Phosphatase: 72 U/L (ref 38–126)
Anion gap: 10 (ref 5–15)
BILIRUBIN TOTAL: 0.6 mg/dL (ref 0.3–1.2)
BUN: 5 mg/dL — AB (ref 6–20)
CO2: 21 mmol/L — ABNORMAL LOW (ref 22–32)
CREATININE: 0.64 mg/dL (ref 0.44–1.00)
Calcium: 8.6 mg/dL — ABNORMAL LOW (ref 8.9–10.3)
Chloride: 103 mmol/L (ref 98–111)
GFR calc Af Amer: >60 mL/min (ref >60)
Glucose, Bld: 105 mg/dL — ABNORMAL HIGH (ref 70–99)
Potassium: 3.3 mmol/L — ABNORMAL LOW (ref 3.5–5.1)
Sodium: 134 mmol/L — ABNORMAL LOW (ref 135–145)
TOTAL PROTEIN: 7.6 g/dL (ref 6.5–8.1)

## 2018-02-27 LAB — URINALYSIS, MICROSCOPIC (REFLEX)

## 2018-02-27 LAB — PREGNANCY, URINE: PREG TEST UR: NEGATIVE

## 2018-02-27 MED ORDER — DICYCLOMINE HCL 20 MG PO TABS: 20.0000 mg | ORAL_TABLET | Freq: Two times a day (BID) | ORAL | 0 refills | Status: AC

## 2018-02-27 MED ORDER — DICYCLOMINE HCL 10 MG/ML IM SOLN
20.0000 mg | Freq: Once | INTRAMUSCULAR | Status: AC
Start: 2018-02-27 — End: 2018-02-27
  Administered 2018-02-27: 20 mg via INTRAMUSCULAR
  Filled 2018-02-27: qty 2

## 2018-02-27 MED ORDER — POTASSIUM CHLORIDE CRYS ER 20 MEQ PO TBCR
40.0000 meq | EXTENDED_RELEASE_TABLET | Freq: Once | ORAL | Status: AC
  Administered 2018-02-27: 40 meq via ORAL
  Filled 2018-02-27: qty 2

## 2018-02-27 MED ORDER — SODIUM CHLORIDE 0.9 % IV BOLUS: 1000.0000 mL | Freq: Once | INTRAVENOUS | Status: DC

## 2018-02-27 MED ORDER — SODIUM CHLORIDE 0.9 % IV BOLUS
1000.0000 mL | Freq: Once | INTRAVENOUS | Status: AC
  Administered 2018-02-27: 1000 mL via INTRAVENOUS

## 2018-02-27 MED ORDER — LOPERAMIDE HCL 2 MG PO CAPS: 2.0000 mg | ORAL_CAPSULE | Freq: Four times a day (QID) | ORAL | 0 refills | Status: AC | PRN

## 2018-02-27 NOTE — ED Triage Notes (Signed)
Pt states that she has lower abdominal pain and cramping x4 days with diarrhea

## 2018-02-27 NOTE — ED Notes (Signed)
ED Provider at bedside. 

## 2018-02-27 NOTE — ED Notes (Signed)
Provided ice chips to patient.

## 2018-02-27 NOTE — ED Provider Notes (Signed)
[facility] EMERGENCY DEPARTMENT Provider Note   CSN: [id number] Arrival date & time: 02/27/18  4098     History   Chief Complaint Chief Complaint  Patient presents with  . Abdominal Pain    HPI Donna Duncan is a 37 y.o. female.  HPI Presents with concern for diarrhea, about 8 times daily since Tuesday Stomach cramping, bilateral lower abdomen, then will need to use bathroom No nausea or vomiting No black or bloody stools Not sure about fever, feels sweaty from pain at times No dysuria, no vaginal discharge or bleeding  Micah Flesher out of town last weekend for a wedding, doesn't know anyone who is sick. Now eating crackers and water, body armor No recent abx No fam hx of IBD   Past Medical History:  Diagnosis Date  . Gallstones   . GERD (gastroesophageal reflux disease)     Patient Active Problem List   Diagnosis Date Noted  . Symptomatic cholelithiasis 07/20/2012    Past Surgical History:  Procedure Laterality Date  . APPENDECTOMY    . CESAREAN SECTION    . CHOLECYSTECTOMY       OB History   None      Home Medications    Prior to Admission medications   Medication Sig Start Date End Date Taking? Authorizing Provider  ALPRAZolam Prudy Feeler) 0.5 MG tablet Take 0.5 mg by mouth at bedtime as needed for anxiety.   Yes [provider]  dicyclomine (BENTYL) 20 MG tablet Take 1 tablet (20 mg total) by mouth 2 (two) times daily. 02/27/18   Alvira Monday, MD  ibuprofen (ADVIL,MOTRIN) 200 MG tablet Take 600 mg by mouth every 6 (six) hours as needed.    [provider]  loperamide (IMODIUM) 2 MG capsule Take 1 capsule (2 mg total) by mouth 4 (four) times daily as needed for diarrhea or loose stools. 02/27/18   Alvira Monday, MD  Norethindrone-Ethinyl Estradiol-Fe Biphas (LO LOESTRIN FE) 1 MG-10 MCG / 10 MCG tablet Take 1 tablet by mouth daily.    [provider]  ondansetron (ZOFRAN ODT) 8 MG disintegrating tablet Take 1 tablet  (8 mg total) by mouth every 8 (eight) hours as needed for nausea. 07/19/12   Margarita Grizzle, MD  oxyCODONE-acetaminophen (PERCOCET/ROXICET) 5-325 MG per tablet Take 1 tablet by mouth every 4 (four) hours as needed for pain. 07/19/12   Margarita Grizzle, MD  pantoprazole (PROTONIX) 40 MG tablet Take 40 mg by mouth daily.      [provider]  phentermine 15 MG capsule Take 15 mg by mouth every morning.      [provider]    Family History No family history on file.  Social History Social History   Tobacco Use  . Smoking status: Current Every Day Smoker    Packs/day: 0.50    Types: Cigarettes  . Smokeless tobacco: Never Used  Substance Use Topics  . Alcohol use: Yes    Comment: occasional  . Drug use: No     Allergies   Patient has no known allergies.   Review of Systems Review of Systems  Constitutional: Negative for fever.  Eyes: Negative for visual disturbance.  Respiratory: Negative for cough and shortness of breath.   Cardiovascular: Negative for chest pain.  Gastrointestinal: Positive for abdominal pain and diarrhea. Negative for blood in stool, constipation, nausea and vomiting.  Genitourinary: Negative for difficulty urinating, dysuria, vaginal bleeding and vaginal discharge.  Musculoskeletal: Positive for back pain (mild lower back from laying down  a lot).  Skin: Negative for rash.  Neurological: Negative for syncope and headaches.     Physical Exam Updated Vital Signs BP 129/74 (BP Location: Right Arm)   Pulse 96   Temp 97.7 F (36.5 C) (Oral)   Resp 18   Ht 5\' 1"  (1.549 m)   Wt 70.3 kg   LMP 02/17/2018 (Exact Date)   SpO2 100%   BMI 29.29 kg/m   Physical Exam  Constitutional: She is oriented to person, place, and time. She appears well-developed and well-nourished. No distress.  HENT:  Head: Normocephalic and atraumatic.  Eyes: Conjunctivae and EOM are normal.  Neck: Normal range of motion.  Cardiovascular: Normal rate, regular  rhythm, normal heart sounds and intact distal pulses. Exam reveals no gallop and no friction rub.  No murmur heard. Pulmonary/Chest: Effort normal and breath sounds normal. No respiratory distress. She has no wheezes. She has no rales.  Abdominal: Soft. She exhibits no distension. There is tenderness (mild discomfort lower abdomen, soft no guarding, no focal tenderness). There is no guarding.  Musculoskeletal: She exhibits no edema or tenderness.  Neurological: She is alert and oriented to person, place, and time.  Skin: Skin is warm and dry. No rash noted. She is not diaphoretic. No erythema.  Nursing note and vitals reviewed.    ED Treatments / Results  Labs (all labs ordered are listed, but only abnormal results are displayed) Labs Reviewed  URINALYSIS, ROUTINE W REFLEX MICROSCOPIC - Abnormal; Notable for the following components:      Result Value   Hgb urine dipstick LARGE (*)    Ketones, ur 15 (*)    Protein, ur 30 (*)    All other components within normal limits  URINALYSIS, MICROSCOPIC (REFLEX) - Abnormal; Notable for the following components:   Bacteria, UA FEW (*)    All other components within normal limits  COMPREHENSIVE METABOLIC PANEL - Abnormal; Notable for the following components:   Sodium 134 (*)    Potassium 3.3 (*)    CO2 21 (*)    Glucose, Bld 105 (*)    BUN 5 (*)    Calcium 8.6 (*)    All other components within normal limits  PREGNANCY, URINE  CBC WITH DIFFERENTIAL/PLATELET    EKG None  Radiology No results found.  Procedures Procedures (including critical care time)  Medications Ordered in ED Medications  sodium chloride 0.9 % bolus 1,000 mL (has no administration in time range)  sodium chloride 0.9 % bolus 1,000 mL (1,000 mLs Intravenous New Bag/Given 02/27/18 0945)  dicyclomine (BENTYL) injection 20 mg (20 mg Intramuscular Given 02/27/18 0933)  potassium chloride SA (K-DUR,KLOR-CON) CR tablet 40 mEq (40 mEq Oral Given 02/27/18 1034)      Initial Impression / Assessment and Plan / ED Course  I have reviewed the triage vital signs and the nursing notes.  Pertinent labs & imaging results that were available during my care of the patient were reviewed by me and considered in my medical decision making (see chart for details).     37 year old female presents with concern for diarrhea beginning 3 days ago.  Patient has had cholecystectomy, appendectomy, and does not have focal pain or tenderness to suggest diverticulitis.  History and exam are not consistent with small bowel obstruction.  Denies urinary symptoms, pelvic symptoms.  Suspect likely viral etiology of patient's diarrhea.  Discussed possibility of bacterial etiology as well, but at this point would not recommend other interventions. No risk factors for cdiff.  Recommend  continued hydration, symptomatic care.  Given the amount of diarrhea described, labs were done which showed no significant abnormalities, given K for mild hypokalemia.  Patient was given IV fluid and Bentyl.  Recommend PCP follow-up, given prescription for Bentyl and immodium.  Patient discharged in stable condition with understanding of reasons to return.   Final Clinical Impressions(s) / ED Diagnoses   Final diagnoses:  Colitis    ED Discharge Orders         Ordered    dicyclomine (BENTYL) 20 MG tablet  2 times daily     02/27/18 1026    loperamide (IMODIUM) 2 MG capsule  4 times daily PRN     02/27/18 1026           Alvira Monday, MD 02/27/18 1041

## 2022-08-14 ENCOUNTER — Other Ambulatory Visit: Payer: Self-pay | Admitting: Obstetrics and Gynecology
